# Patient Record
Sex: Female | Born: 2000 | Race: Black or African American | Hispanic: No | Marital: Single | Smoking: Never smoker
Health system: Southern US, Community
[De-identification: ages and names within clinical notes are randomized; demographics above are authoritative.]

## PROBLEM LIST (undated history)

## (undated) ENCOUNTER — Emergency Department (HOSPITAL_COMMUNITY): Admission: EM | Discharge status: Absent without leave | Payer: Self-pay

---
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
## 2019-02-20 ENCOUNTER — Encounter (HOSPITAL_COMMUNITY): Payer: Self-pay | Admitting: Obstetrics and Gynecology

## 2019-02-20 ENCOUNTER — Other Ambulatory Visit: Payer: Self-pay

## 2019-02-20 ENCOUNTER — Emergency Department (HOSPITAL_COMMUNITY)
Admission: EM | Admit: 2019-02-20 | Discharge: 2019-02-21 | Disposition: A | Discharge status: Home / Self Care | Payer: Managed Care, Other (non HMO) | Attending: Emergency Medicine | Admitting: Emergency Medicine

## 2019-02-20 DIAGNOSIS — M791 Myalgia, unspecified site: Secondary | ICD-10-CM | POA: Diagnosis not present

## 2019-02-20 DIAGNOSIS — R51 Headache: Secondary | ICD-10-CM | POA: Diagnosis not present

## 2019-02-20 DIAGNOSIS — Z5321 Procedure and treatment not carried out due to patient leaving prior to being seen by health care provider: Secondary | ICD-10-CM | POA: Diagnosis not present

## 2019-02-20 NOTE — ED Triage Notes (Signed)
Per Pt: Pt reports headaches and body aches. Pt is requesting a COVID test.  Pt reports she works at [employer] and is unsure if she has been exposed.

## 2019-02-21 ENCOUNTER — Other Ambulatory Visit: Payer: Self-pay

## 2019-02-21 DIAGNOSIS — Z20822 Contact with and (suspected) exposure to covid-19: Secondary | ICD-10-CM

## 2019-02-23 LAB — SPECIMEN STATUS REPORT

## 2019-02-23 LAB — NOVEL CORONAVIRUS, NAA: SARS-CoV-2, NAA: NOT DETECTED

## 2019-03-31 ENCOUNTER — Ambulatory Visit (HOSPITAL_COMMUNITY)
Admission: EM | Admit: 2019-03-31 | Discharge: 2019-03-31 | Disposition: A | Discharge status: Home / Self Care | Payer: Managed Care, Other (non HMO) | Attending: Family Medicine | Admitting: Family Medicine

## 2019-03-31 ENCOUNTER — Other Ambulatory Visit: Payer: Self-pay

## 2019-03-31 ENCOUNTER — Encounter (HOSPITAL_COMMUNITY): Payer: Self-pay

## 2019-03-31 DIAGNOSIS — Z20828 Contact with and (suspected) exposure to other viral communicable diseases: Secondary | ICD-10-CM | POA: Insufficient documentation

## 2019-03-31 DIAGNOSIS — J029 Acute pharyngitis, unspecified: Secondary | ICD-10-CM | POA: Insufficient documentation

## 2019-03-31 LAB — POCT RAPID STREP A: Streptococcus, Group A Screen (Direct): NEGATIVE

## 2019-03-31 MED ORDER — ACETAMINOPHEN 325 MG PO TABS
650.0000 mg | ORAL_TABLET | Freq: Once | ORAL | Status: AC
  Administered 2019-03-31: 650 mg via ORAL

## 2019-03-31 MED ORDER — ACETAMINOPHEN 325 MG PO TABS
ORAL_TABLET | ORAL | Status: AC
  Filled 2019-03-31: qty 2

## 2019-03-31 NOTE — ED Provider Notes (Signed)
[place]    CSN: [id number] Arrival date & time: 03/31/19  1005      History   Chief Complaint Chief Complaint  Patient presents with  . Sore Throat    HPI Lori Stokes is a 18 y.o. female.   Patient is a 18 year old female that presents today with sore throat.  This is been constant since yesterday.  She has had pain with swallowing.  Fever of 101.2.  Has not take anything for her symptoms.  Denies any associated cough, chest congestion, rhinorrhea, ear pain. No known sick contacts or recent traveling.  No known COVID exposures.  ROS per HPI      History reviewed. No pertinent past medical history.  There are no active problems to display for this patient.   History reviewed. No pertinent surgical history.  OB History   No obstetric history on file.      Home Medications    Prior to Admission medications   Not on File    Family History History reviewed. No pertinent family history.  Social History Social History   Tobacco Use  . Smoking status: Never Smoker  . Smokeless tobacco: Never Used  Substance Use Topics  . Alcohol use: Yes  . Drug use: Not Currently     Allergies   Patient has no known allergies.   Review of Systems Review of Systems   Physical Exam Triage Vital Signs ED Triage Vitals  Enc Vitals Group     BP 03/31/19 1026 (!) 148/87     Pulse Rate 03/31/19 1026 (!) 101     Resp 03/31/19 1026 18     Temp 03/31/19 1026 (!) 101.2 F (38.4 C)     Temp src --      SpO2 03/31/19 1026 97 %     Weight 03/31/19 1020 210 lb (95.3 kg)     Height --      Head Circumference --      Peak Flow --      Pain Score 03/31/19 1020 10     Pain Loc --      Pain Edu? --      Excl. in [place]? --    No data found.  Updated Vital Signs BP (!) 148/87 (BP Location: Right Arm)   Pulse (!) 101   Temp (!) 101.2 F (38.4 C)   Resp 18   Wt 210 lb (95.3 kg)   SpO2 97%   BMI 38.41 kg/m   Visual Acuity Right Eye Distance:    Left Eye Distance:   Bilateral Distance:    Right Eye Near:   Left Eye Near:    Bilateral Near:     Physical Exam Constitutional:      General: She is not in acute distress.    Appearance: She is well-developed. She is not ill-appearing, toxic-appearing or diaphoretic.  HENT:     Head: Normocephalic and atraumatic.     Right Ear: Tympanic membrane and ear canal normal.     Left Ear: Tympanic membrane and ear canal normal.     Mouth/Throat:     Pharynx: Uvula midline. Posterior oropharyngeal erythema present.     Tonsils: 2+ on the right. 2+ on the left.  Neck:     Musculoskeletal: Normal range of motion.  Cardiovascular:     Rate and Rhythm: Regular rhythm. Tachycardia present.  Pulmonary:     Effort: Pulmonary effort is normal.     Breath sounds: Normal breath sounds.  Lymphadenopathy:  Cervical: No cervical adenopathy.  Skin:    General: Skin is warm and dry.  Neurological:     Mental Status: She is alert.  Psychiatric:        Mood and Affect: Mood normal.      UC Treatments / Results  Labs (all labs ordered are listed, but only abnormal results are displayed) Labs Reviewed  NOVEL CORONAVIRUS, NAA (HOSP ORDER, SEND-OUT TO REF LAB; TAT 18-24 HRS)  POCT RAPID STREP A    EKG   Radiology No results found.  Procedures Procedures (including critical care time)  Medications Ordered in UC Medications  acetaminophen (TYLENOL) tablet 650 mg (650 mg Oral Given 03/31/19 1032)  acetaminophen (TYLENOL) 325 MG tablet (has no administration in time range)    Initial Impression / Assessment and Plan / UC Course  I have reviewed the triage vital signs and the nursing notes.  Pertinent labs & imaging results that were available during my care of the patient were reviewed by me and considered in my medical decision making (see chart for details).     Sore throat.  Most likely viral pharyngitis.  We will send swab for culture Also testing for COVID with labs  pending. Recommended Tylenol and ibuprofen for pain and fever.  Warm salt water gargles as needed Final Clinical Impressions(s) / UC Diagnoses   Final diagnoses:  Sore throat     Discharge Instructions     Your strep test was negative.  We will send this for culture.  This is most likely viral.  Treat the symptoms with ibuprofen or Tylenol for fever and pain.  Warm salt water gargles could help. We will have your COVID results in a few days Follow up as needed for continued or worsening symptoms     ED Prescriptions    None     PDMP not reviewed this encounter.   Dahlia ByesBast, Aristeo Hankerson A, NP 03/31/19 1100

## 2019-03-31 NOTE — ED Notes (Signed)
Throat culture completed and sent to main lab for processing.

## 2019-03-31 NOTE — Discharge Instructions (Signed)
Your strep test was negative.  We will send this for culture.  This is most likely viral.  Treat the symptoms with ibuprofen or Tylenol for fever and pain.  Warm salt water gargles could help. We will have your COVID results in a few days Follow up as needed for continued or worsening symptoms

## 2019-03-31 NOTE — ED Triage Notes (Signed)
Pt states she has a sore throat. This started yesterday.

## 2019-04-01 ENCOUNTER — Emergency Department (HOSPITAL_COMMUNITY)
Admission: EM | Admit: 2019-04-01 | Discharge: 2019-04-01 | Disposition: A | Discharge status: Home / Self Care | Payer: Managed Care, Other (non HMO) | Attending: Emergency Medicine | Admitting: Emergency Medicine

## 2019-04-01 ENCOUNTER — Other Ambulatory Visit: Payer: Self-pay

## 2019-04-01 ENCOUNTER — Encounter (HOSPITAL_COMMUNITY): Payer: Self-pay

## 2019-04-01 ENCOUNTER — Emergency Department (HOSPITAL_COMMUNITY): Payer: Managed Care, Other (non HMO)

## 2019-04-01 DIAGNOSIS — R070 Pain in throat: Secondary | ICD-10-CM | POA: Diagnosis present

## 2019-04-01 DIAGNOSIS — R05 Cough: Secondary | ICD-10-CM | POA: Insufficient documentation

## 2019-04-01 DIAGNOSIS — J020 Streptococcal pharyngitis: Secondary | ICD-10-CM | POA: Insufficient documentation

## 2019-04-01 DIAGNOSIS — R059 Cough, unspecified: Secondary | ICD-10-CM

## 2019-04-01 LAB — GROUP A STREP BY PCR: Group A Strep by PCR: DETECTED — AB

## 2019-04-01 LAB — NOVEL CORONAVIRUS, NAA (HOSP ORDER, SEND-OUT TO REF LAB; TAT 18-24 HRS): SARS-CoV-2, NAA: NOT DETECTED

## 2019-04-01 MED ORDER — DEXAMETHASONE SODIUM PHOSPHATE 10 MG/ML IJ SOLN
10.0000 mg | Freq: Once | INTRAMUSCULAR | Status: AC
  Administered 2019-04-01: 14:00:00 10 mg via INTRAMUSCULAR
  Filled 2019-04-01: qty 1

## 2019-04-01 MED ORDER — PENICILLIN G BENZATHINE 1200000 UNIT/2ML IM SUSP
1.2000 10*6.[IU] | Freq: Once | INTRAMUSCULAR | Status: AC
  Administered 2019-04-01: 15:00:00 1.2 10*6.[IU] via INTRAMUSCULAR
  Filled 2019-04-01: qty 2

## 2019-04-01 NOTE — Discharge Instructions (Signed)
Over the next several days you should rest as much as possible, and drink more fluids than usual. Liquids will help thin and loosen mucus so you can cough it up. Liquids will also help prevent dehydration. Using a cool mist humidifier or a vaporizer to increase air moisture in your home can also make it easier for you to breathe and help decrease your cough.  To help soothe a sore throat gargle with warm salt water.  Make salt water by dissolving  teaspoon salt in 1 cup warm water. You may also use throat lozenges and over the counter sore throat spray.  You may alternate taking Tylenol and Ibuprofen as needed for pain control. You may take 400-600 mg of ibuprofen every 6 hours and [phone] mg of Tylenol every 6 hours. Do not exceed 4000 mg of Tylenol daily as this can lead to liver damage. Also, make sure to take Ibuprofen with meals as it can cause an upset stomach. Do not take other NSAIDs while taking Ibuprofen such as (Aleve, Naprosyn, Aspirin, Celebrex, etc) and do not take more than the prescribed dose as this can lead to ulcers and bleeding in your GI tract. You may use warm and cold compresses to help with your symptoms.   Please follow up with your primary doctor within the next 7-10 days for re-evaluation and further treatment of your symptoms.   Please return to the ER sooner if you have any new or worsening symptoms.

## 2019-04-01 NOTE — ED Provider Notes (Signed)
[facility]-EMERGENCY DEPT Provider Note   CSN: [id number] Arrival date & time: 04/01/19  1110     History   Chief Complaint Chief Complaint  Patient presents with  . Sore Throat    HPI Bernie Fobes is a 18 y.o. female.     HPI   Patient is a 18 year old female presents emergency department today complaining of a sore throat that started 3 days ago.  She is tried taking Tylenol without significant relief.  Denies any problems eating/drinking at home.  She has had associated fevers.  She is also had rhinorrhea/nasal congestion and a cough.  She denies any chest pain or shortness of breath.  She denies being around anyone that has been diagnosed with the coronavirus.  She was seen in urgent care yesterday and had a negative stress test.  She was also tested for COVID but the results have not become available yet.  She works at [employer].  History reviewed. No pertinent past medical history.  There are no active problems to display for this patient.   History reviewed. No pertinent surgical history.   OB History   No obstetric history on file.      Home Medications    Prior to Admission medications   Not on File    Family History History reviewed. No pertinent family history.  Social History Social History   Tobacco Use  . Smoking status: Never Smoker  . Smokeless tobacco: Never Used  Substance Use Topics  . Alcohol use: Yes  . Drug use: Not Currently     Allergies   Patient has no known allergies.   Review of Systems Review of Systems  Constitutional: Positive for fever.  HENT: Positive for congestion, rhinorrhea and sore throat.   Eyes: Negative for visual disturbance.  Respiratory: Positive for cough. Negative for shortness of breath.   Cardiovascular: Negative for chest pain.  Gastrointestinal: Negative for abdominal pain, constipation, diarrhea, nausea and vomiting.  Genitourinary: Negative for dysuria and hematuria.   Musculoskeletal: Negative for back pain.  Skin: Negative for color change and rash.  Neurological: Negative for headaches.  All other systems reviewed and are negative.    Physical Exam Updated Vital Signs BP (!) 160/91 (BP Location: Right Arm)   Pulse (!) 103   Temp 99.4 F (37.4 C) (Oral)   Resp 18   SpO2 100%   Physical Exam Vitals signs and nursing note reviewed.  Constitutional:      General: She is not in acute distress.    Appearance: She is well-developed. She is not ill-appearing or toxic-appearing.  HENT:     Head: Normocephalic and atraumatic.     Mouth/Throat:     Mouth: Mucous membranes are moist.     Pharynx: Uvula midline. Posterior oropharyngeal erythema present. No oropharyngeal exudate or uvula swelling.     Tonsils: No tonsillar exudate or tonsillar abscesses. 0 on the right. 0 on the left.  Eyes:     Conjunctiva/sclera: Conjunctivae normal.  Neck:     Musculoskeletal: Neck supple.  Cardiovascular:     Rate and Rhythm: Normal rate and regular rhythm.     Heart sounds: Normal heart sounds. No murmur.  Pulmonary:     Effort: Pulmonary effort is normal. No respiratory distress.     Breath sounds: Normal breath sounds. No wheezing, rhonchi or rales.  Abdominal:     Palpations: Abdomen is soft.     Tenderness: There is no abdominal tenderness.  Lymphadenopathy:  Cervical: No cervical adenopathy.  Skin:    General: Skin is warm and dry.  Neurological:     Mental Status: She is alert.     ED Treatments / Results  Labs (all labs ordered are listed, but only abnormal results are displayed) Labs Reviewed  GROUP A STREP BY PCR - Abnormal; Notable for the following components:      Result Value   Group A Strep by PCR DETECTED (*)    All other components within normal limits    EKG None  Radiology Dg Chest Portable 1 View  Result Date: 04/01/2019 CLINICAL DATA:  Cough.  Sore throat. EXAM: PORTABLE CHEST 1 VIEW COMPARISON:  None. FINDINGS: The  heart size and mediastinal contours are within normal limits. Both lungs are clear. The visualized skeletal structures are unremarkable. IMPRESSION: No active disease. Electronically Signed   By: Constance Holster M.D.   On: 04/01/2019 14:13    Procedures Procedures (including critical care time)  Medications Ordered in ED Medications  penicillin g benzathine (BICILLIN LA) 1200000 UNIT/2ML injection 1.2 Million Units (has no administration in time range)  dexamethasone (DECADRON) injection 10 mg (10 mg Intramuscular Given 04/01/19 1343)     Initial Impression / Assessment and Plan / ED Course  I have reviewed the triage vital signs and the nursing notes.  Pertinent labs & imaging results that were available during my care of the patient were reviewed by me and considered in my medical decision making (see chart for details).     Final Clinical Impressions(s) / ED Diagnoses   Final diagnoses:  Strep pharyngitis  Cough   Patient is a 18 year old female presents emergency department today complaining of a sore throat that started 3 days ago.  She is tried taking Tylenol without significant relief.  Denies any problems eating/drinking at home.  She has had associated fevers.  She is also had rhinorrhea/nasal congestion and a cough.  She denies any chest pain or shortness of breath.  She denies being around anyone that has been diagnosed with the coronavirus.  She was seen in urgent care yesterday and had a negative stress test.  She was also tested for COVID but the results have not become available yet.  CXR w/o evidence to suggest bacteria pneumonia or pneumothorax. I offered rx for cough medication and she declined.  Strep test ordered by nursing staff, and was positive. She was given a dose of decadron and IM PCN in the ED.    Was able to tolerate PO in the ED.  Advised Tylenol, Motrin and supportive care at home.  Advised hydration.  Advised her to self quarantine until she  receives the results of her COVID test that was ordered at urgent care.  Advised follow-up with PCP in 1 week and return to the ED for new or worsening symptoms.  She voices understanding of the plan and reasons to return.  All questions answered.  Patient stable for discharge.   ED Discharge Orders    None       Bishop Dublin 04/01/19 1421    Carmin Muskrat, MD 04/02/19 [phone]

## 2019-04-01 NOTE — ED Triage Notes (Signed)
Pt presents with c/o sore throat that started on Friday, reports pain 10/10.

## 2019-04-03 ENCOUNTER — Encounter (HOSPITAL_COMMUNITY): Payer: Self-pay

## 2019-10-10 ENCOUNTER — Ambulatory Visit: Payer: Managed Care, Other (non HMO) | Attending: Internal Medicine

## 2021-05-12 IMAGING — DX DG CHEST 1V PORT
1 series · 1 of 1 positions shown · non-contrast
Comparison: None.

CLINICAL DATA: Cough.  Sore throat.

EXAM:
PORTABLE CHEST 1 VIEW

[chest ap]
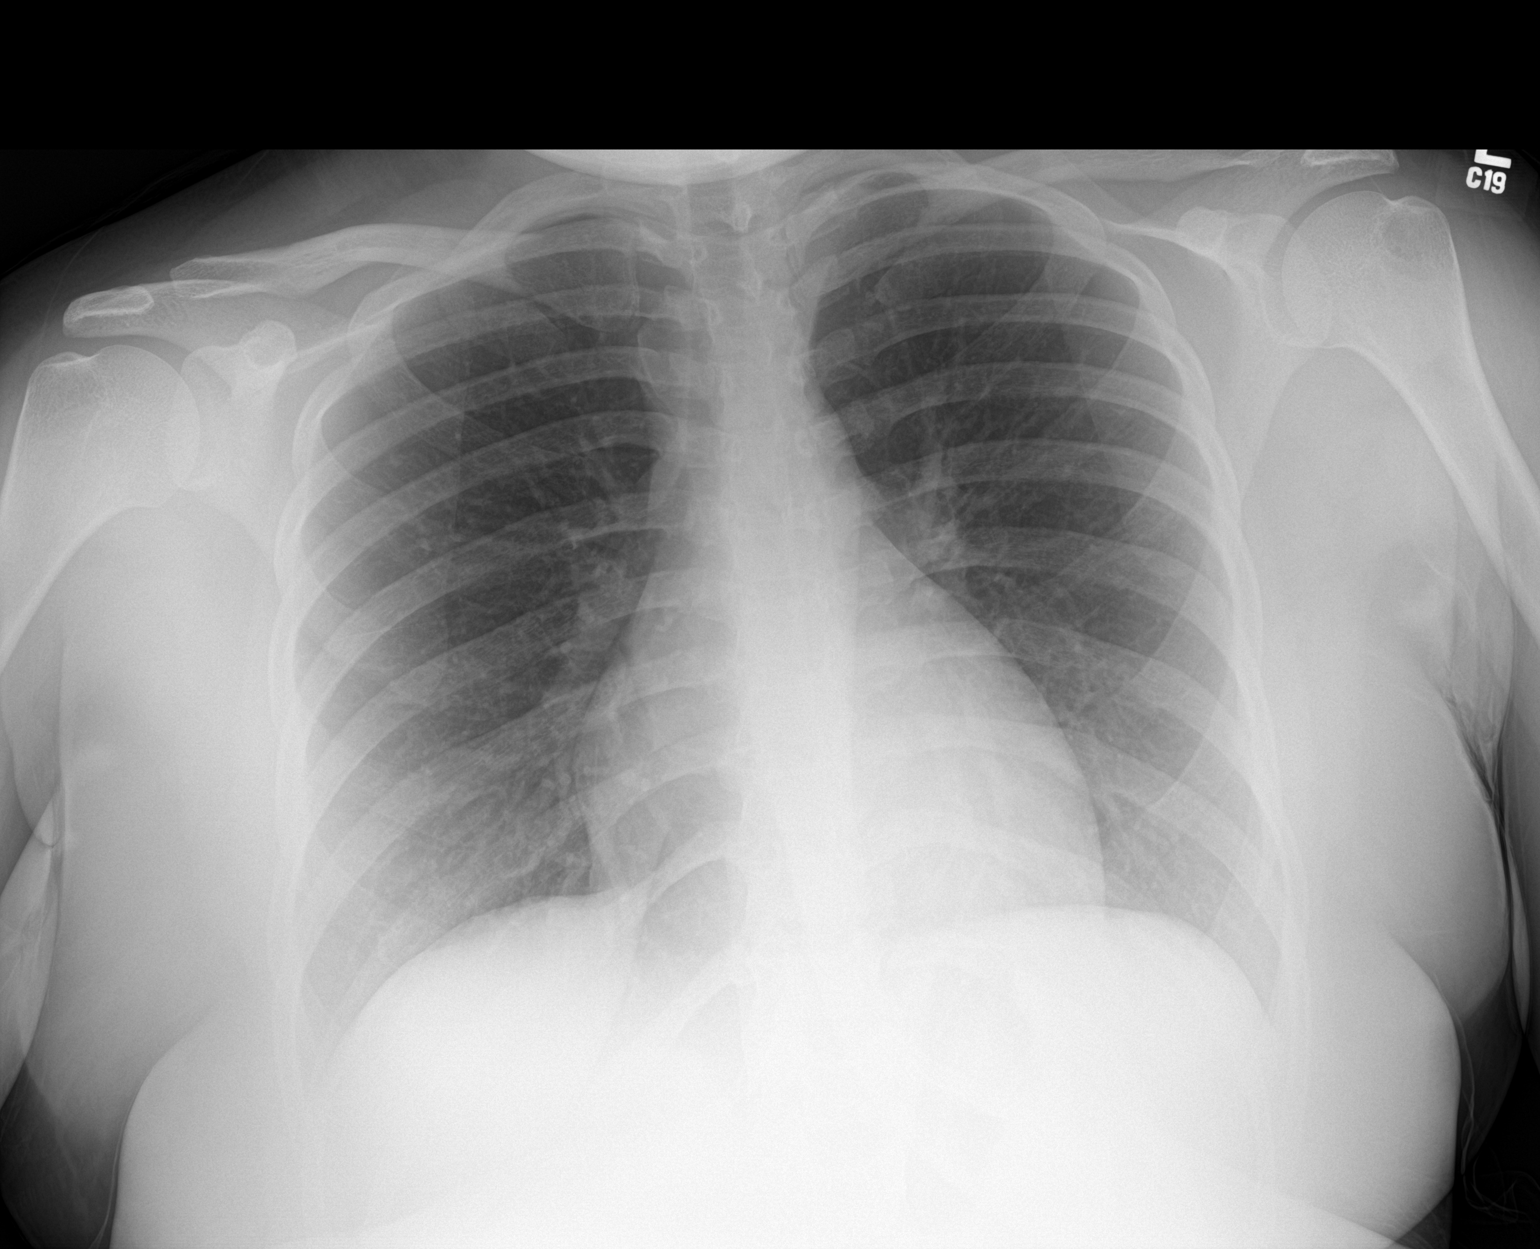

[1 of 1 positions shown; findings below may reference images not displayed]

FINDINGS: The heart size and mediastinal contours are within normal limits.
Both lungs are clear. The visualized skeletal structures are
unremarkable.
IMPRESSION: No active disease.
